# Patient Record
Sex: Female | Born: 1970 | Hispanic: Yes | Marital: Married | Smoking: Never smoker
Health system: Southern US, Community
[De-identification: ages and names within clinical notes are randomized; demographics above are authoritative.]

## PROBLEM LIST (undated history)

## (undated) DIAGNOSIS — R112 Nausea with vomiting, unspecified: Secondary | ICD-10-CM

## (undated) DIAGNOSIS — D649 Anemia, unspecified: Secondary | ICD-10-CM

## (undated) DIAGNOSIS — Z8489 Family history of other specified conditions: Secondary | ICD-10-CM

## (undated) DIAGNOSIS — Z789 Other specified health status: Secondary | ICD-10-CM

## (undated) DIAGNOSIS — E611 Iron deficiency: Secondary | ICD-10-CM

## (undated) DIAGNOSIS — Z9889 Other specified postprocedural states: Secondary | ICD-10-CM

## (undated) HISTORY — DX: Iron deficiency: E61.1

## (undated) HISTORY — PX: CYSTECTOMY: SUR359

---

## 2005-08-03 ENCOUNTER — Emergency Department: Payer: Self-pay | Admitting: Unknown Physician Specialty

## 2017-02-04 ENCOUNTER — Ambulatory Visit: Payer: Self-pay | Attending: Oncology | Admitting: *Deleted

## 2017-02-04 ENCOUNTER — Ambulatory Visit
Admission: RE | Admit: 2017-02-04 | Discharge: 2017-02-04 | Disposition: A | Discharge status: Home / Self Care | Payer: Self-pay | Source: Ambulatory Visit | Attending: Oncology | Admitting: Oncology

## 2017-02-04 ENCOUNTER — Encounter: Payer: Self-pay | Admitting: *Deleted

## 2017-02-04 VITALS — BP 117/85 | HR 79 | Temp 98.5°F | Ht 62.0 in | Wt 231.0 lb

## 2017-02-04 DIAGNOSIS — N63 Unspecified lump in unspecified breast: Secondary | ICD-10-CM

## 2017-02-04 NOTE — Progress Notes (Signed)
Subjective:     Patient ID: Nichole West, female   DOB: Nov 25, 1970, 46 y.o.   MRN: [id number]  HPI   Review of Systems     Objective:   Physical Exam  Pulmonary/Chest: Right breast exhibits no inverted nipple, no mass, no nipple discharge, no skin change and no tenderness. Left breast exhibits no inverted nipple, no mass, no nipple discharge, no skin change and no tenderness. Breasts are symmetrical.         Assessment:     46 year old Hispanic female referred to [place] by the [facility] for palpabe breast mass at 3:00 left breast. Lloyda, the interpreter present during the interview and exam.  The patient states her husband noticed the nodules about 6 months ago.  Denies tenderness.  On clinical breast exam there is no dominant mass, nipple discharge, skin changes or lymphadenopathy.  I can palpate an asymetrical thickening at 12:00 left breast.  I can only palpate fibroglandular like tissue at the 3:00 area bilateral.  I asked the patient to pinpoint the area of concern.  She was unable to locate the previous area of concern.  Taught self breast awareness.  Patient has been screened for eligibility.  She does not have any insurance, Medicare or Medicaid.  She also meets financial eligibility.  Hand-out given on the Affordable Care Act.    Plan:    Will go ahead and get bilateral diagnostic mammogram and ultrasound.  Will follow-up per [facility] protocol.

## 2017-02-04 NOTE — Patient Instructions (Signed)
Gave patient hand-out, Women Staying Healthy, Active and Well from [facility], with education on breast health, pap smears, heart and colon health. 

## 2017-02-05 ENCOUNTER — Other Ambulatory Visit: Payer: Self-pay | Admitting: *Deleted

## 2017-02-05 DIAGNOSIS — N63 Unspecified lump in unspecified breast: Secondary | ICD-10-CM

## 2017-02-13 ENCOUNTER — Ambulatory Visit: Payer: Self-pay

## 2017-02-20 ENCOUNTER — Ambulatory Visit
Admission: RE | Admit: 2017-02-20 | Discharge: 2017-02-20 | Disposition: A | Discharge status: Home / Self Care | Payer: Self-pay | Source: Ambulatory Visit | Attending: Oncology | Admitting: Oncology

## 2017-02-20 DIAGNOSIS — N63 Unspecified lump in unspecified breast: Secondary | ICD-10-CM

## 2017-02-20 HISTORY — PX: BREAST EXCISIONAL BIOPSY: SUR124

## 2017-02-21 LAB — SURGICAL PATHOLOGY

## 2017-02-22 ENCOUNTER — Telehealth: Payer: Self-pay | Admitting: *Deleted

## 2017-02-22 NOTE — Telephone Encounter (Signed)
Called patient via the interpreter Randall Hiss.  Informed her of her benign biopsy and need for surgical consult for possible removal of the papilloma.  She is scheduled to see Dr. Jamal Collin on 02/26/17 at 4:15.  She is arrive 30 minutes early, take a photo ID and all her meds.  She is to call if she has any questions.

## 2017-02-26 ENCOUNTER — Encounter: Payer: Self-pay | Admitting: *Deleted

## 2017-02-26 ENCOUNTER — Ambulatory Visit (INDEPENDENT_AMBULATORY_CARE_PROVIDER_SITE_OTHER): Payer: PRIVATE HEALTH INSURANCE | Admitting: General Surgery

## 2017-02-26 VITALS — BP 130/72 | HR 94 | Resp 12 | Ht 60.0 in | Wt 231.0 lb

## 2017-02-26 DIAGNOSIS — D242 Benign neoplasm of left breast: Secondary | ICD-10-CM

## 2017-02-26 DIAGNOSIS — N6012 Diffuse cystic mastopathy of left breast: Secondary | ICD-10-CM

## 2017-02-26 NOTE — Progress Notes (Signed)
Patient ID: Nichole West, female   DOB: March 05, 1971, 46 y.o.   MRN: [id number]  Chief Complaint  Patient presents with  . Breast Problem    HPI Nichole West is a 46 y.o. female.  who presents for a breast evaluation referred by Tanya Nones [facility]. The most recent mammogram and biopsy was done on 02-20-17. This was her first mammogram. Patient does not perform regular self breast checks.    She could feel something in the left breast but when she went to [facility] they could not feel anything, she was then referred to [facility] for mammograms. Her daughter is present. Interpreter, Barnie Alderman, present for interview, exam and discussion.   HPI  Past Medical History:  Diagnosis Date  . Iron deficiency     Past Surgical History:  Procedure Laterality Date  . BREAST BIOPSY Left 02/20/2017   INTRADUCTAL PAPILLOMA WITH USUAL DUCTAL HYPERPLASIA, COLUMNAR CELL CHANGES  . CESAREAN SECTION      Family History  Problem Relation Age of Onset  . Breast cancer Neg Hx   . Colon cancer Neg Hx     Social History Social History  Substance Use Topics  . Smoking status: Never Smoker  . Smokeless tobacco: Never Used  . Alcohol use No    No Known Allergies  Current Outpatient Prescriptions  Medication Sig Dispense Refill  . acetaminophen (TYLENOL) 325 MG tablet Take 650 mg by mouth every 6 (six) hours as needed.    . Ferrous Gluconate (IRON 27 PO) Take by mouth 2 (two) times daily.    Marland Kitchen ibuprofen (ADVIL,MOTRIN) 600 MG tablet Take 600 mg by mouth every 6 (six) hours as needed.      No current facility-administered medications for this visit.     Review of Systems Review of Systems  Constitutional: Negative.   Respiratory: Negative.   Cardiovascular: Negative.     Blood pressure 130/72, pulse 94, resp. rate 12, height 5' (1.524 m), weight 231 lb (104.8 kg), last menstrual period 02/01/2017.  Physical Exam Physical Exam  Constitutional: She is oriented to person, place, and  time. She appears well-developed and well-nourished.  HENT:  Mouth/Throat: Oropharynx is clear and moist.  Eyes: Conjunctivae are normal. No scleral icterus.  Neck: Neck supple.  Cardiovascular: Normal rate, regular rhythm and normal heart sounds.   Pulmonary/Chest: Effort normal and breath sounds normal. Right breast exhibits no inverted nipple, no mass, no nipple discharge, no skin change and no tenderness. Left breast exhibits no inverted nipple, no mass, no nipple discharge, no skin change and no tenderness.  Abdominal: Soft. There is no tenderness.  Lymphadenopathy:    She has no cervical adenopathy.    She has no axillary adenopathy.  Neurological: She is alert and oriented to person, place, and time.  Skin: Skin is warm and dry.  Psychiatric: Her behavior is normal.    Data Reviewed  Mammogram and pathology reviewed Multiple cysts seen on left breast. A small hypoechoic mass at 5 ocl close to areola was core biopsied Assessment Multiople breast cysts   Left breast mass-core biopsied. 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 No atypia noted    Plan   Explained the pathology findings and small chance for pathologic upgrade if excised. Discussed options in detail,close observation vs excision of the left breast mass. Procedure of excision also explained She wishes to think about this and call back with her decision.      HPI, Physical Exam, Assessment  and Plan have been scribed under the direction and in the presence of Mckinley Jewel, MD Karie Fetch, RN I have completed the exam and reviewed the above documentation for accuracy and completeness.  I agree with the above.  Haematologist has been used and any errors in dictation or transcription are unintentional.  Raeshawn Vo G. Jamal Collin, M.D., F.A.C.S.  Junie Panning G 02/26/2017, 7:00 PM

## 2017-02-26 NOTE — Patient Instructions (Signed)
The patient is aware to call back for any questions or concerns.  

## 2017-03-13 ENCOUNTER — Other Ambulatory Visit: Payer: Self-pay | Admitting: General Surgery

## 2017-03-13 DIAGNOSIS — D242 Benign neoplasm of left breast: Secondary | ICD-10-CM

## 2017-03-14 ENCOUNTER — Other Ambulatory Visit: Payer: Self-pay | Admitting: General Surgery

## 2017-03-14 ENCOUNTER — Other Ambulatory Visit: Payer: Self-pay | Admitting: *Deleted

## 2017-03-14 ENCOUNTER — Telehealth: Payer: Self-pay | Admitting: *Deleted

## 2017-03-14 DIAGNOSIS — D242 Benign neoplasm of left breast: Secondary | ICD-10-CM

## 2017-03-14 NOTE — Telephone Encounter (Signed)
I called and spoke with patient's daughter, Geni Bers, today.   Patient's surgery has been scheduled for 03-26-17. Surgery and pre-admit instructions were reviewed with her today. She verbalizes understanding.   Paperwork with instructions have been mailed to the patient.   Daughter aware to call if questions.

## 2017-03-19 ENCOUNTER — Encounter
Admission: RE | Admit: 2017-03-19 | Discharge: 2017-03-19 | Disposition: A | Discharge status: Home / Self Care | Payer: Self-pay | Source: Ambulatory Visit | Attending: General Surgery | Admitting: General Surgery

## 2017-03-19 DIAGNOSIS — Z01818 Encounter for other preprocedural examination: Secondary | ICD-10-CM | POA: Insufficient documentation

## 2017-03-19 HISTORY — DX: Family history of other specified conditions: Z84.89

## 2017-03-19 HISTORY — DX: Nausea with vomiting, unspecified: R11.2

## 2017-03-19 HISTORY — DX: Anemia, unspecified: D64.9

## 2017-03-19 HISTORY — DX: Other specified postprocedural states: Z98.890

## 2017-03-19 HISTORY — DX: Other specified health status: Z78.9

## 2017-03-19 NOTE — Patient Instructions (Signed)
Your procedure is scheduled on: Tues 03/26/17 Su procedimiento est programado para: Report to Mammography. 8:00 :  Remember: Instructions that are not followed completely may result in serious medical risk, up to and including death, or upon the discretion of your surgeon and anesthesiologist your surgery may need to be rescheduled.  Recuerde: Las instrucciones que no se siguen completamente Heritage manager en un riesgo de salud grave, incluyendo hasta la [place] o a discrecin de su cirujano y Environmental health practitioner, su ciruga se puede posponer.   _x___ 1. Do not eat food or drink liquids after midnight. No gum chewing or hard candies.  No coma alimentos ni tome lquidos despus de la medianoche.  No mastique chicle ni caramelos  duros.     __x__ 2. No alcohol for 24 hours before or after surgery.    No tome alcohol durante las 24 horas antes ni despus de la [place].   ____ 3. Bring all medications with you on the day of surgery if instructed.    Lleve todos los medicamentos con usted el da de su ciruga si se le ha indicado as.   __x__ 4. Notify your doctor if there is any change in your medical condition (cold, fever,                             infections).    Informe a su mdico si hay algn cambio en su condicin mdica (resfriado, fiebre, infecciones).   Do not wear jewelry, make-up, hairpins, clips or nail polish.  No use joyas, maquillajes, pinzas/ganchos para el cabello ni esmalte de uas.  Do not wear lotions, powders, or perfumes. You may wear deodorant.  No use lociones, polvos o perfumes.  Puede usar desodorante.    Do not shave 48 hours prior to surgery. Men may shave face and neck.  No se afeite 48 horas antes de la [place].  Los hombres pueden [employer] cara y el cuello.   Do not bring valuables to the hospital.   No lleve objetos [place] is not responsible for any belongings or valuables.  [place] no se hace responsable de ningn tipo de  pertenencias u objetos de Geographical information systems officer.               Contacts, dentures or bridgework may not be worn into surgery.  Los lentes de [place], las dentaduras postizas o puentes no se pueden usar en la [place].  Leave your suitcase in the car. After surgery it may be brought to your room.  Deje su maleta en el auto.  Despus de la ciruga podr traerla a su habitacin.  For patients admitted to the hospital, discharge time is determined by your treatment team.  Para los pacientes que sean ingresados al hospital, el tiempo en el cual se le dar de alta es determinado por su                equipo de [place].   Patients discharged the day of surgery will not be allowed to drive home. A los pacientes que se les da de alta el mismo da de la ciruga no se les permitir conducir a Holiday representative.   Please read over the following fact sheets that you were given: Por favor [place] hojas de informacin que le dieron:      __x__ Take these medicines the morning of surgery with A SIP OF WATER:  Tome estas medicinas la maana de la ciruga con UN SORBO DE AGUA:  1. acetaminophen (TYLENOL) 325 MG tablet if needed  2.   3.   4.       5.  6.  ____ Fleet Enema (as directed)          Enema de Fleet (segn lo indicado)    __x__ Use CHG Soap as directed          Utilice el jabn de CHG segn lo indicado  ____ Use inhalers on the day of surgery          Use los inhaladores el da de la ciruga  ____ Stop metformin 2 days prior to surgery          Deje de tomar el metformin 2 das antes de la ciruga    ____ Take 1/2 of usual insulin dose the night before surgery and none on the morning of surgery           Tome la mitad de la dosis habitual de insulina la noche antes de la [place] y no tome nada en la maana de la             ciruga  ____ Stop Coumadin/Plavix/aspirin on           Deje de tomar el Coumadin/Plavix/aspirina el da:  __x__ Stop Anti-inflammatories on today           Deje  de tomar antiinflamatorios el da:   ____ Stop supplements until after surgery            Deje de tomar suplementos hasta despus de la ciruga  ____ Bring C-Pap to the hospital          [place] al hospital

## 2017-03-19 NOTE — Pre-Procedure Instructions (Deleted)
Contacted Dr. Nicholaus Bloom office regarding pt's Brilinta and aspirin.  Cameron Proud PA instructed for pt to stop both meds today and restart on 03/21/17 after surgery.

## 2017-03-26 ENCOUNTER — Encounter: Payer: Self-pay | Admitting: Anesthesiology

## 2017-03-26 ENCOUNTER — Ambulatory Visit
Admission: RE | Admit: 2017-03-26 | Discharge: 2017-03-26 | Disposition: A | Discharge status: Home / Self Care | Payer: Self-pay | Source: Ambulatory Visit | Attending: General Surgery | Admitting: General Surgery

## 2017-03-26 ENCOUNTER — Ambulatory Visit: Payer: Self-pay | Admitting: Certified Registered"

## 2017-03-26 ENCOUNTER — Encounter
Admission: RE | Disposition: A | Discharge status: Home / Self Care | Payer: Self-pay | Source: Ambulatory Visit | Attending: General Surgery

## 2017-03-26 DIAGNOSIS — D242 Benign neoplasm of left breast: Secondary | ICD-10-CM

## 2017-03-26 DIAGNOSIS — N6022 Fibroadenosis of left breast: Secondary | ICD-10-CM | POA: Insufficient documentation

## 2017-03-26 DIAGNOSIS — N6002 Solitary cyst of left breast: Secondary | ICD-10-CM | POA: Insufficient documentation

## 2017-03-26 HISTORY — PX: BREAST LUMPECTOMY WITH NEEDLE LOCALIZATION: SHX5759

## 2017-03-26 LAB — CBC
HEMATOCRIT: 37.4 % (ref 35.0–47.0)
HEMOGLOBIN: 12.8 g/dL (ref 12.0–16.0)
MCH: 29 pg (ref 26.0–34.0)
MCHC: 34.2 g/dL (ref 32.0–36.0)
MCV: 84.7 fL (ref 80.0–100.0)
Platelets: 297 10*3/uL (ref 150–440)
RBC: 4.42 MIL/uL (ref 3.80–5.20)
RDW: 15.2 % — ABNORMAL HIGH (ref 11.5–14.5)
WBC: 8.2 10*3/uL (ref 3.6–11.0)

## 2017-03-26 LAB — POCT PREGNANCY, URINE: Preg Test, Ur: NEGATIVE

## 2017-03-26 SURGERY — BREAST LUMPECTOMY WITH NEEDLE LOCALIZATION
Anesthesia: General | Site: Breast | Laterality: Left | Wound class: Clean

## 2017-03-26 MED ORDER — FENTANYL CITRATE (PF) 100 MCG/2ML IJ SOLN
INTRAMUSCULAR | Status: AC
  Filled 2017-03-26: qty 2

## 2017-03-26 MED ORDER — LIDOCAINE HCL (PF) 1 % IJ SOLN
INTRAMUSCULAR | Status: AC
  Filled 2017-03-26: qty 30

## 2017-03-26 MED ORDER — LIDOCAINE HCL 1 % IJ SOLN
INTRAMUSCULAR | Status: DC | PRN
  Administered 2017-03-26: 8 mL

## 2017-03-26 MED ORDER — BUPIVACAINE HCL (PF) 0.5 % IJ SOLN
INTRAMUSCULAR | Status: AC
  Filled 2017-03-26: qty 30

## 2017-03-26 MED ORDER — TRAMADOL HCL 50 MG PO TABS: 50.0000 mg | ORAL_TABLET | Freq: Four times a day (QID) | ORAL | 0 refills | Status: AC | PRN

## 2017-03-26 MED ORDER — ONDANSETRON HCL 4 MG/2ML IJ SOLN
INTRAMUSCULAR | Status: DC | PRN
Start: 2017-03-26 — End: 2017-03-26
  Administered 2017-03-26 (×2): 4 mg via INTRAVENOUS

## 2017-03-26 MED ORDER — PROPOFOL 10 MG/ML IV BOLUS
INTRAVENOUS | Status: AC
  Filled 2017-03-26: qty 40

## 2017-03-26 MED ORDER — PROPOFOL 10 MG/ML IV BOLUS
INTRAVENOUS | Status: DC | PRN
  Administered 2017-03-26: 50 mg via INTRAVENOUS
  Administered 2017-03-26: 200 mg via INTRAVENOUS
  Administered 2017-03-26: 50 mg via INTRAVENOUS

## 2017-03-26 MED ORDER — ONDANSETRON HCL 4 MG/2ML IJ SOLN: 4.0000 mg | Freq: Once | INTRAMUSCULAR | Status: DC | PRN

## 2017-03-26 MED ORDER — LACTATED RINGERS IV SOLN
INTRAVENOUS | Status: DC
  Administered 2017-03-26: 10:00:00 via INTRAVENOUS

## 2017-03-26 MED ORDER — MIDAZOLAM HCL 2 MG/2ML IJ SOLN
INTRAMUSCULAR | Status: DC | PRN
  Administered 2017-03-26: 2 mg via INTRAVENOUS

## 2017-03-26 MED ORDER — FENTANYL CITRATE (PF) 100 MCG/2ML IJ SOLN
25.0000 ug | INTRAMUSCULAR | Status: DC | PRN
  Administered 2017-03-26: 25 ug via INTRAVENOUS

## 2017-03-26 MED ORDER — GLYCOPYRROLATE 0.2 MG/ML IJ SOLN
INTRAMUSCULAR | Status: DC | PRN
  Administered 2017-03-26: 0.2 mg via INTRAVENOUS

## 2017-03-26 MED ORDER — GLYCOPYRROLATE 0.2 MG/ML IJ SOLN
INTRAMUSCULAR | Status: AC
  Filled 2017-03-26: qty 1

## 2017-03-26 MED ORDER — ONDANSETRON HCL 4 MG/2ML IJ SOLN
INTRAMUSCULAR | Status: AC
  Filled 2017-03-26: qty 2

## 2017-03-26 MED ORDER — DEXAMETHASONE SODIUM PHOSPHATE 10 MG/ML IJ SOLN
INTRAMUSCULAR | Status: DC | PRN
  Administered 2017-03-26: 10 mg via INTRAVENOUS

## 2017-03-26 MED ORDER — CHLORHEXIDINE GLUCONATE CLOTH 2 % EX PADS: 6.0000 | MEDICATED_PAD | Freq: Once | CUTANEOUS | Status: DC

## 2017-03-26 MED ORDER — MIDAZOLAM HCL 2 MG/2ML IJ SOLN
INTRAMUSCULAR | Status: AC
  Filled 2017-03-26: qty 2

## 2017-03-26 MED ORDER — FENTANYL CITRATE (PF) 100 MCG/2ML IJ SOLN
INTRAMUSCULAR | Status: AC
  Administered 2017-03-26: 25 ug via INTRAVENOUS
  Filled 2017-03-26: qty 2

## 2017-03-26 MED ORDER — FAMOTIDINE 20 MG PO TABS
20.0000 mg | ORAL_TABLET | Freq: Once | ORAL | Status: AC
  Administered 2017-03-26: 20 mg via ORAL

## 2017-03-26 MED ORDER — FAMOTIDINE 20 MG PO TABS
ORAL_TABLET | ORAL | Status: AC
  Administered 2017-03-26: 20 mg via ORAL
  Filled 2017-03-26: qty 1

## 2017-03-26 MED ORDER — TRAMADOL HCL 50 MG PO TABS
50.0000 mg | ORAL_TABLET | Freq: Four times a day (QID) | ORAL | Status: DC
  Administered 2017-03-26: 50 mg via ORAL

## 2017-03-26 MED ORDER — DEXAMETHASONE SODIUM PHOSPHATE 10 MG/ML IJ SOLN
INTRAMUSCULAR | Status: AC
  Filled 2017-03-26: qty 1

## 2017-03-26 MED ORDER — LACTATED RINGERS IV SOLN
INTRAVENOUS | Status: DC | PRN
  Administered 2017-03-26: 10:00:00 via INTRAVENOUS

## 2017-03-26 MED ORDER — FENTANYL CITRATE (PF) 100 MCG/2ML IJ SOLN
INTRAMUSCULAR | Status: DC | PRN
  Administered 2017-03-26 (×4): 25 ug via INTRAVENOUS

## 2017-03-26 MED ORDER — TRAMADOL HCL 50 MG PO TABS
ORAL_TABLET | ORAL | Status: AC
  Filled 2017-03-26: qty 1

## 2017-03-26 MED ORDER — BUPIVACAINE HCL 0.5 % IJ SOLN
INTRAMUSCULAR | Status: DC | PRN
  Administered 2017-03-26: 8 mL

## 2017-03-26 SURGICAL SUPPLY — 37 items
BLADE SURG 15 STRL SS SAFETY (BLADE) ×2 IMPLANT
CANISTER SUCT 1200ML W/VALVE (MISCELLANEOUS) ×2 IMPLANT
CHLORAPREP W/TINT 26ML (MISCELLANEOUS) ×2 IMPLANT
COVER PROBE FLX POLY STRL (MISCELLANEOUS) ×2 IMPLANT
DECANTER SPIKE VIAL GLASS SM (MISCELLANEOUS) ×2 IMPLANT
DERMABOND ADVANCED (GAUZE/BANDAGES/DRESSINGS) ×1
DERMABOND ADVANCED .7 DNX12 (GAUZE/BANDAGES/DRESSINGS) ×1 IMPLANT
DEVICE DUBIN SPECIMEN MAMMOGRA (MISCELLANEOUS) ×2 IMPLANT
DEVICE LOCALIZATION ULTRAWIRE (WIRE) IMPLANT
DEVICE ULTRAWIRE LOCAL 19X9 (WIRE) IMPLANT
DRAPE FLUOR MINI C-ARM 54X84 (DRAPES) ×2 IMPLANT
DRAPE LAPAROTOMY 100X77 ABD (DRAPES) ×2 IMPLANT
ELECT CAUTERY BLADE TIP 2.5 (TIP) ×2
ELECT REM PT RETURN 9FT ADLT (ELECTROSURGICAL) ×2
ELECTRODE CAUTERY BLDE TIP 2.5 (TIP) ×1 IMPLANT
ELECTRODE REM PT RTRN 9FT ADLT (ELECTROSURGICAL) ×1 IMPLANT
GLOVE BIO SURGEON STRL SZ7 (GLOVE) ×2 IMPLANT
GOWN STRL REUS W/ TWL LRG LVL3 (GOWN DISPOSABLE) ×3 IMPLANT
GOWN STRL REUS W/TWL LRG LVL3 (GOWN DISPOSABLE) ×3
KIT RM TURNOVER STRD PROC AR (KITS) ×2 IMPLANT
LABEL OR SOLS (LABEL) ×2 IMPLANT
MARGIN MAP 10MM (MISCELLANEOUS) ×2 IMPLANT
NDL HPO THNWL 1X22GA REG BVL (NEEDLE) ×1 IMPLANT
NEEDLE HYPO 25X1 1.5 SAFETY (NEEDLE) ×2 IMPLANT
NEEDLE SAFETY 22GX1 (NEEDLE) ×1
PACK BASIN MINOR ARMC (MISCELLANEOUS) ×2 IMPLANT
SUT ETHILON 3-0 FS-10 30 BLK (SUTURE) ×2
SUT VIC AB 2-0 SH 27 (SUTURE) ×1
SUT VIC AB 2-0 SH 27XBRD (SUTURE) ×1 IMPLANT
SUT VIC AB 3-0 54X BRD REEL (SUTURE) ×1 IMPLANT
SUT VIC AB 3-0 BRD 54 (SUTURE) ×1
SUT VIC AB 4-0 FS2 27 (SUTURE) ×2 IMPLANT
SUTURE EHLN 3-0 FS-10 30 BLK (SUTURE) ×1 IMPLANT
SYR CONTROL 10ML (SYRINGE) ×2 IMPLANT
ULTRAWIRE LOCAL DEVICE 19X9 (WIRE)
ULTRAWIRE LOCALIZATION DEVICE (WIRE)
WATER STERILE IRR 1000ML POUR (IV SOLUTION) ×2 IMPLANT

## 2017-03-26 NOTE — Anesthesia Preprocedure Evaluation (Addendum)
Anesthesia Evaluation  Patient identified by MRN, date of birth, ID band Patient awake    Reviewed: Allergy & Precautions, NPO status , Patient's Chart, lab work & pertinent test results, reviewed documented beta blocker date and time   History of Anesthesia Complications (+) PONV, Family history of anesthesia reaction and history of anesthetic complications  Airway Mallampati: III  TM Distance: >3 FB     Dental  (+) Chipped   Pulmonary           Cardiovascular      Neuro/Psych    GI/Hepatic   Endo/Other    Renal/GU      Musculoskeletal   Abdominal   Peds  Hematology  (+) anemia ,   Anesthesia Other Findings Overbite. Daughter went into a coma after a C-section - cause not known. Obese.  Reproductive/Obstetrics                            Anesthesia Physical Anesthesia Plan  ASA: III  Anesthesia Plan: General   Post-op Pain Management:    Induction: Intravenous  PONV Risk Score and Plan:   Airway Management Planned: LMA  Additional Equipment:   Intra-op Plan:   Post-operative Plan:   Informed Consent: I have reviewed the patients History and Physical, chart, labs and discussed the procedure including the risks, benefits and alternatives for the proposed anesthesia with the patient or authorized representative who has indicated his/her understanding and acceptance.     Plan Discussed with: CRNA  Anesthesia Plan Comments:         Anesthesia Quick Evaluation

## 2017-03-26 NOTE — Discharge Instructions (Signed)
CIRUGIA AMBULATORIA       Instruccionnes de alta    Date Toma Copier) 25 de Septiembre 2018   1.  Las drogas que se Statistician en su cuerpo [employer], asi      que por las proximas 24 horas usted no debe:   Conducir Scientist, research (medical)) un automovil   Hacer ninguna decision legal   Tomar ninguna bebida alcoholica  2.  A) [place] puede comenzar una dieta regular.  Es mejor que hoy empiece con                    liquidos y gradualmente anada comidas solidas.       B) Puede comer cualquier comida que desee pero es mejor empezar con liquidos,               luego sopitas con galletas saladas y gradualmente llegar a las comidas solidas.  3.  Por favor avise a su medico inmediatamente si usted tiene algun sangrado anormal,       tiene dificultad con la respiracion, enrojecimiento y Social research officer, government en el sitio de la cirugia,     [place], fiebro o dolor que se alivia con [place].  4.  A) Su visita posoperatoria (despues de su operacion) es con el  Dr. Elon Alas: 2 de Terrilee Croak                   Tiempo: 3:15 PM        B)  Por favor llame para hacer la cita posoperatoria.  5.  Istrucciones especificas :

## 2017-03-26 NOTE — Op Note (Signed)
Preop diagnosis:intraductal papilloma left breast  Post op diagnosis: same  Operation: excision left breast the papilloma with wire localization  Surgeon: Mckinley Jewel  Assistant:     Anesthesia: Gen.  Complications: none  EBL: minimal  Drains: none  Description: patient underwent a wire localization of a lesion located near the 5:00 location in the left breast, near the areolar region. Patient was then brought the operating room and put to sleep with an LMA. Left breast was prepped and draped sterile field.Timeout was. A 15 mL of 0.5% Marcaine mixed 1 Xylocaine was instilled for postop analgesia around the areolar margin inferiorly. A circumareolar incision was then made in the inferior aspect of the breast.The wire entrance was located the medial loca side of the lesion. The skin and subcutaneous tissue with an elevated all the way towards the wire entrance side and was wire was then freed from the skin. The wire was followed for a short distance but because of thesignificant length before the thick part of the wir could be re part of this wire was transected and the rest of the wire was then followed to excise out a tissue core breast seem to contain a firm area consistent with the biopsy site. This was some the lateral end of the excision and appeared to be pretty well-circumscribed. Specimen mammogram confirmed the presence of the clip. The specimen was tagged for margins. After specimen mammogram the tissue was sent for pathology.after ensuring hemostasis the er tissue closed interrupted 2-0s also the subcutaneous tissue and the skin covered with Dermabond. Procedure was well-tolerated and she was subsequently returned recovery room stable condition

## 2017-03-26 NOTE — Interval H&P Note (Signed)
History and Physical Interval Note:  03/26/2017 9:01 AM  Nichole West  has presented today for surgery, with the diagnosis of INTRDUCTAL PAPILLOMA  The various methods of treatment have been discussed with the patient and family. After consideration of risks, benefits and other options for treatment, the patient has consented to  Procedure(s): BREAST LUMPECTOMY WITH NEEDLE LOCALIZATION (Left) as a surgical intervention .  The patient's history has been reviewed, patient examined, no change in status, stable for surgery.  I have reviewed the patient's chart and labs.  Questions were answered to the patient's satisfaction.     [name]

## 2017-03-26 NOTE — Anesthesia Procedure Notes (Signed)
Procedure Name: LMA Insertion Date/Time: 03/26/2017 10:23 AM Performed by: Timoteo Expose Pre-anesthesia Checklist: Patient identified, Emergency Drugs available, Suction available, Patient being monitored and Timeout performed Patient Re-evaluated:Patient Re-evaluated prior to induction Oxygen Delivery Method: Circle system utilized Preoxygenation: Pre-oxygenation with 100% oxygen Induction Type: IV induction Ventilation: Mask ventilation without difficulty LMA: LMA inserted LMA Size: 4.5 Number of attempts: 2 Placement Confirmation: breath sounds checked- equal and bilateral,  positive ETCO2 and CO2 detector

## 2017-03-26 NOTE — Transfer of Care (Signed)
Immediate Anesthesia Transfer of Care Note  Patient: Nichole West  Procedure(s) Performed: Procedure(s): BREAST LUMPECTOMY WITH NEEDLE LOCALIZATION (Left)  Patient Location: PACU  Anesthesia Type:General  Level of Consciousness: awake  Airway & Oxygen Therapy: Patient Spontanous Breathing  Post-op Assessment: Report given to RN  Post vital signs: Reviewed  Last Vitals:  Vitals:   03/26/17 0858 03/26/17 1136  BP: 133/78 111/79  Pulse: 88 (!) 102  Resp: 18 18  Temp: 37.1 C (!) 36.3 C  SpO2: 100% 100%    Last Pain:  Vitals:   03/26/17 0858  TempSrc: Oral         Complications: No apparent anesthesia complications

## 2017-03-26 NOTE — Anesthesia Postprocedure Evaluation (Signed)
Anesthesia Post Note  Patient: Nichole West  Procedure(s) Performed: Procedure(s) (LRB): BREAST LUMPECTOMY WITH NEEDLE LOCALIZATION (Left)  Patient location during evaluation: PACU Anesthesia Type: General Level of consciousness: awake and alert Pain management: pain level controlled Vital Signs Assessment: post-procedure vital signs reviewed and stable Respiratory status: spontaneous breathing, nonlabored ventilation, respiratory function stable and patient connected to nasal cannula oxygen Cardiovascular status: blood pressure returned to baseline and stable Postop Assessment: no apparent nausea or vomiting Anesthetic complications: no     Last Vitals:  Vitals:   03/26/17 1218 03/26/17 1221  BP:  118/74  Pulse: 72 68  Resp: (!) 21 19  Temp:  36.4 C  SpO2: 100% 100%    Last Pain:  Vitals:   03/26/17 1221  TempSrc:   PainSc: [place]

## 2017-03-26 NOTE — Transfer of Care (Signed)
Immediate Anesthesia Transfer of Care Note  Patient: Nichole West  Procedure(s) Performed: Procedure(s): BREAST LUMPECTOMY WITH NEEDLE LOCALIZATION (Left)  Patient Location: PACU  Anesthesia Type:General  Level of Consciousness: drowsy and patient cooperative  Airway & Oxygen Therapy: Patient Spontanous Breathing and Patient connected to face mask oxygen  Post-op Assessment: Report given to RN and Post -op Vital signs reviewed and stable  Post vital signs: Reviewed and stable  Last Vitals:  Vitals:   03/26/17 0858 03/26/17 1136  BP: 133/78 111/79  Pulse: 88 (!) 102  Resp: 18 18  Temp: 37.1 C   SpO2: 100% 100%    Last Pain:  Vitals:   03/26/17 0858  TempSrc: Oral         Complications: No apparent anesthesia complications

## 2017-03-26 NOTE — Anesthesia Post-op Follow-up Note (Signed)
Anesthesia QCDR form completed.        

## 2017-03-26 NOTE — H&P (View-Only) (Signed)
Patient ID: Nichole West, female   DOB: 04/05/1971, 46 y.o.   MRN: [id number]  Chief Complaint  Patient presents with  . Breast Problem    HPI Nichole West is a 46 y.o. female.  who presents for a breast evaluation referred by Tanya Nones [facility]. The most recent mammogram and biopsy was done on 02-20-17. This was her first mammogram. Patient does not perform regular self breast checks.    She could feel something in the left breast but when she went to [facility] they could not feel anything, she was then referred to [facility] for mammograms. Her daughter is present. Interpreter, Barnie Alderman, present for interview, exam and discussion.   HPI  Past Medical History:  Diagnosis Date  . Iron deficiency     Past Surgical History:  Procedure Laterality Date  . BREAST BIOPSY Left 02/20/2017   INTRADUCTAL PAPILLOMA WITH USUAL DUCTAL HYPERPLASIA, COLUMNAR CELL CHANGES  . CESAREAN SECTION      Family History  Problem Relation Age of Onset  . Breast cancer Neg Hx   . Colon cancer Neg Hx     Social History Social History  Substance Use Topics  . Smoking status: Never Smoker  . Smokeless tobacco: Never Used  . Alcohol use No    No Known Allergies  Current Outpatient Prescriptions  Medication Sig Dispense Refill  . acetaminophen (TYLENOL) 325 MG tablet Take 650 mg by mouth every 6 (six) hours as needed.    . Ferrous Gluconate (IRON 27 PO) Take by mouth 2 (two) times daily.    Marland Kitchen ibuprofen (ADVIL,MOTRIN) 600 MG tablet Take 600 mg by mouth every 6 (six) hours as needed.      No current facility-administered medications for this visit.     Review of Systems Review of Systems  Constitutional: Negative.   Respiratory: Negative.   Cardiovascular: Negative.     Blood pressure 130/72, pulse 94, resp. rate 12, height 5' (1.524 m), weight 231 lb (104.8 kg), last menstrual period 02/01/2017.  Physical Exam Physical Exam  Constitutional: She is oriented to person, place, and  time. She appears well-developed and well-nourished.  HENT:  Mouth/Throat: Oropharynx is clear and moist.  Eyes: Conjunctivae are normal. No scleral icterus.  Neck: Neck supple.  Cardiovascular: Normal rate, regular rhythm and normal heart sounds.   Pulmonary/Chest: Effort normal and breath sounds normal. Right breast exhibits no inverted nipple, no mass, no nipple discharge, no skin change and no tenderness. Left breast exhibits no inverted nipple, no mass, no nipple discharge, no skin change and no tenderness.  Abdominal: Soft. There is no tenderness.  Lymphadenopathy:    She has no cervical adenopathy.    She has no axillary adenopathy.  Neurological: She is alert and oriented to person, place, and time.  Skin: Skin is warm and dry.  Psychiatric: Her behavior is normal.    Data Reviewed  Mammogram and pathology reviewed Multiple cysts seen on left breast. A small hypoechoic mass at 5 ocl close to areola was core biopsied Assessment Multiople breast cysts   Left breast mass-core biopsied. 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 No atypia noted    Plan   Explained the pathology findings and small chance for pathologic upgrade if excised. Discussed options in detail,close observation vs excision of the left breast mass. Procedure of excision also explained She wishes to think about this and call back with her decision.      HPI, Physical Exam, Assessment  and Plan have been scribed under the direction and in the presence of Mckinley Jewel, MD Karie Fetch, RN I have completed the exam and reviewed the above documentation for accuracy and completeness.  I agree with the above.  Haematologist has been used and any errors in dictation or transcription are unintentional.  Kaelynn Igo G. Jamal Collin, M.D., F.A.C.S.  Junie Panning G 02/26/2017, 7:00 PM

## 2017-03-28 LAB — SURGICAL PATHOLOGY

## 2017-03-29 ENCOUNTER — Telehealth: Payer: Self-pay

## 2017-03-29 NOTE — Telephone Encounter (Signed)
Notified patient as instructed with [place] from [employer], patient pleased. Discussed follow-up appointments, patient agrees.

## 2017-03-29 NOTE — Telephone Encounter (Signed)
-----   Message from Christene Lye, MD sent at 03/29/2017  8:39 AM EDT ----- Path reviewed. Benign papilloma.  Please inform pt.

## 2017-04-02 ENCOUNTER — Encounter: Payer: Self-pay | Admitting: General Surgery

## 2017-04-02 ENCOUNTER — Ambulatory Visit (INDEPENDENT_AMBULATORY_CARE_PROVIDER_SITE_OTHER): Payer: PRIVATE HEALTH INSURANCE | Admitting: General Surgery

## 2017-04-02 VITALS — BP 144/86 | HR 88 | Resp 12 | Ht 60.0 in | Wt 224.0 lb

## 2017-04-02 DIAGNOSIS — D242 Benign neoplasm of left breast: Secondary | ICD-10-CM

## 2017-04-02 NOTE — Patient Instructions (Signed)
Return to clinic in 1 month for follow up. 

## 2017-04-02 NOTE — Progress Notes (Signed)
Patient ID: Nichole West, female   DOB: March 05, 1971, 46 y.o.   MRN: [id number]  Chief Complaint  Patient presents with  . Routine Post Op    HPI Nichole West is a 46 y.o. female.  Here today for postoperative visit, left breast lumpectomy on 03-26-17, she states she is doing well. She states she is still having some pain. Interpreter, Bonnita Nasuti, present for interview, exam and discussion.  HPI  Past Medical History:  Diagnosis Date  . Anemia   . Family history of adverse reaction to anesthesia    daughter remained in a coma x 5 days after C section/appendectomy  . Iron deficiency   . Medical history non-contributory   . PONV (postoperative nausea and vomiting)     Past Surgical History:  Procedure Laterality Date  . BREAST BIOPSY Left 02/20/2017   INTRADUCTAL PAPILLOMA WITH USUAL DUCTAL HYPERPLASIA, COLUMNAR CELL CHANGES  . BREAST LUMPECTOMY WITH NEEDLE LOCALIZATION Left 03/26/2017   Procedure: BREAST LUMPECTOMY WITH NEEDLE LOCALIZATION;  Surgeon: Christene Lye, MD;  Location: [facility] ORS;  Service: General;  Laterality: Left;  . CESAREAN SECTION    . CYSTECTOMY Left    lower leg    Family History  Problem Relation Age of Onset  . Breast cancer Neg Hx   . Colon cancer Neg Hx     Social History Social History  Substance Use Topics  . Smoking status: Never Smoker  . Smokeless tobacco: Never Used  . Alcohol use No    No Known Allergies  Current Outpatient Prescriptions  Medication Sig Dispense Refill  . acetaminophen (TYLENOL) 325 MG tablet Take 650 mg by mouth every 6 (six) hours as needed.    . ferrous sulfate 325 (65 FE) MG tablet Take 325 mg by mouth 2 (two) times daily with a meal.    . ibuprofen (ADVIL,MOTRIN) 600 MG tablet Take 600 mg by mouth every 6 (six) hours as needed for moderate pain.     . traMADol (ULTRAM) 50 MG tablet Take 1 tablet (50 mg total) by mouth every 6 (six) hours as needed. 10 tablet 0   No current facility-administered  medications for this visit.     Review of Systems Review of Systems  Constitutional: Negative.   Respiratory: Negative.   Cardiovascular: Negative.     Blood pressure (!) 144/86, pulse 88, resp. rate 12, height 5' (1.524 m), weight 224 lb (101.6 kg), last menstrual period 03/28/2017.  Physical Exam Physical Exam  Constitutional: She is oriented to person, place, and time. She appears well-developed and well-nourished.  Pulmonary/Chest:    Neurological: She is alert and oriented to person, place, and time.  Skin: Skin is warm and dry.  Psychiatric: She has a normal mood and affect. Her behavior is normal.    Data Reviewed Prior notes and pathology reviewed-intraductal papilloma  Assessment    Papilloma of left breast s/p lumpectomy - healing well.     Plan    Return to clinic in 1 month for follow up     HPI, Physical Exam, Assessment and Plan have been scribed under the direction and in the presence of Mckinley Jewel, MD Karie Fetch, RN  I have completed the exam and reviewed the above documentation for accuracy and completeness.  I agree with the above.  Haematologist has been used and any errors in dictation or transcription are unintentional.  Toleen Lachapelle G. Jamal Collin, M.D., F.A.C.S.   Junie Panning G 04/02/2017, 4:13 PM

## 2017-04-10 ENCOUNTER — Encounter: Payer: Self-pay | Admitting: *Deleted

## 2017-04-10 NOTE — Progress Notes (Signed)
Patient with benign pathology.  She is being followed by Dr. Jamal Collin.  Next mammogram in one year.  [facility] to [place].

## 2017-05-06 ENCOUNTER — Ambulatory Visit (INDEPENDENT_AMBULATORY_CARE_PROVIDER_SITE_OTHER): Payer: PRIVATE HEALTH INSURANCE | Admitting: General Surgery

## 2017-05-06 ENCOUNTER — Encounter: Payer: Self-pay | Admitting: General Surgery

## 2017-05-06 VITALS — BP 128/74 | HR 78 | Resp 14 | Ht 61.0 in | Wt 226.0 lb

## 2017-05-06 DIAGNOSIS — D242 Benign neoplasm of left breast: Secondary | ICD-10-CM

## 2017-05-06 NOTE — Patient Instructions (Signed)
Llame si hay algn drenaje de su pezn.

## 2017-05-06 NOTE — Progress Notes (Signed)
Patient ID: Nichole West, female   DOB: 10-Apr-1971, 46 y.o.   MRN: [id number]  Chief Complaint  Patient presents with  . Routine Post Op    HPI Nichole West is a 46 y.o. female. Here today for postoperative visit, left breast lumpectomy done on  03-26-17. Reports she is doing well with only some burning pain at the excision site. She reports one episode of clear drainage from the surgical area the day before yesterday. She is here today with her daughter, Nichole West, and also Nichole West from [employer].  HPI  Past Medical History:  Diagnosis Date  . Anemia   . Family history of adverse reaction to anesthesia    daughter remained in a coma x 5 days after C section/appendectomy  . Iron deficiency   . Medical history non-contributory   . PONV (postoperative nausea and vomiting)     Past Surgical History:  Procedure Laterality Date  . BREAST BIOPSY Left 02/20/2017   INTRADUCTAL PAPILLOMA WITH USUAL DUCTAL HYPERPLASIA, COLUMNAR CELL CHANGES  . CESAREAN SECTION    . CYSTECTOMY Left    lower leg    Family History  Problem Relation Age of Onset  . Breast cancer Neg Hx   . Colon cancer Neg Hx     Social History Social History   Tobacco Use  . Smoking status: Never Smoker  . Smokeless tobacco: Never Used  Substance Use Topics  . Alcohol use: No  . Drug use: No    No Known Allergies  Current Outpatient Medications  Medication Sig Dispense Refill  . acetaminophen (TYLENOL) 325 MG tablet Take 650 mg by mouth every 6 (six) hours as needed.    . ferrous sulfate 325 (65 FE) MG tablet Take 325 mg by mouth 2 (two) times daily with a meal.    . ibuprofen (ADVIL,MOTRIN) 600 MG tablet Take 600 mg by mouth every 6 (six) hours as needed for moderate pain.     . traMADol (ULTRAM) 50 MG tablet Take 1 tablet (50 mg total) by mouth every 6 (six) hours as needed. 10 tablet 0   No current facility-administered medications for this visit.     Review of Systems Review of  Systems  Constitutional: Negative.   Respiratory: Negative.   Cardiovascular: Negative.     Blood pressure 128/74, pulse 78, resp. rate 14, height 5\' 1"  (1.549 m), weight 226 lb (102.5 kg), last menstrual period 05/05/2017.  Physical Exam Physical Exam  Constitutional: She is oriented to person, place, and time. She appears well-developed and well-nourished.  Pulmonary/Chest: Right breast exhibits no inverted nipple, no mass, no nipple discharge, no skin change and no tenderness. Left breast exhibits no inverted nipple, no mass, no nipple discharge, no skin change and no tenderness.    Neurological: She is alert and oriented to person, place, and time.  Skin: Skin is warm and dry.  Psychiatric: She has a normal mood and affect.    Data Reviewed Previous notes, op note, mammogram, surgical pathology Left breast surgical pathology- intraductal papilloma with florid ductal hyperplasia and sclerosis. Negative for atypia and malignancy.   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 Incision healing well. One episode of wound discharge since resolved. No signs of infection or nipple discharge today. Most recent mammogram 01/2017. Discussed that she can return to all normal activities as tolerated.     Plan    Pt can return as needed. Should follow up  with her PCP for yearly mammogram and breast evaluation.     HPI, Physical Exam, Assessment and Plan have been scribed under the direction and in the presence of Mckinley Jewel, MD  Concepcion Living, LPN   I have completed the exam and reviewed the above documentation for accuracy and completeness.  I agree with the above.  Haematologist has been used and any errors in dictation or transcription are unintentional.  Helen Winterhalter G. Jamal Collin, M.D., F.A.C.S.  Junie Panning G 05/07/2017, 2:07 PM

## 2018-03-25 ENCOUNTER — Telehealth: Payer: Self-pay

## 2018-03-25 NOTE — Telephone Encounter (Signed)
Patient called me back and I told her that since she was in the [facility] program last year, she would need to contact [facility] for her to re-apply and then they would schedule her necessary images. Patient was given Christy's phone number. Patient understood and had no further questions at this time. I did tell patient to call me back if she needed additional help.

## 2018-03-25 NOTE — Telephone Encounter (Signed)
Called patient back since I had a note on my desk to give her a call. However, I had to leave her a voicemail to call me back.

## 2018-05-28 ENCOUNTER — Ambulatory Visit
Admission: RE | Admit: 2018-05-28 | Discharge: 2018-05-28 | Disposition: A | Discharge status: Home / Self Care | Payer: Self-pay | Source: Ambulatory Visit | Attending: Oncology | Admitting: Oncology

## 2018-05-28 ENCOUNTER — Ambulatory Visit: Payer: Self-pay | Attending: Oncology

## 2018-05-28 ENCOUNTER — Encounter (INDEPENDENT_AMBULATORY_CARE_PROVIDER_SITE_OTHER): Payer: Self-pay

## 2018-05-28 VITALS — BP 145/83 | HR 62 | Temp 98.3°F | Ht 61.25 in | Wt 200.0 lb

## 2018-05-28 DIAGNOSIS — Z0000 Encounter for general adult medical examination without abnormal findings: Secondary | ICD-10-CM

## 2018-05-28 NOTE — Progress Notes (Signed)
  Subjective:     Patient ID: Nichole West, female   DOB: 26-Nov-1970, 47 y.o.   MRN: [id number]  HPI   Review of Systems     Objective:   Physical Exam  Pulmonary/Chest: Right breast exhibits no inverted nipple, no mass, no nipple discharge, no skin change and no tenderness. Left breast exhibits no inverted nipple, no mass, no nipple discharge, no skin change and no tenderness. Breasts are symmetrical.       Assessment:     47 year old patient presents for [facility] clinic visit.  Being left breast biopsy in 2018 with Dr. Jamal Collin.  Jaqui Laukaitis interpreted exam.  Patient screened, and meets [facility] eligibility.  Patient does not have insurance, Medicare or Medicaid.  Handout given on Affordable Care Act.  Instructed patient on breast self awareness using teach back method.  Clinical breast exam unremarkable.  No mass or lump palpated.    Plan:     Sent for bilateral screening mammogram.

## 2018-06-01 NOTE — Progress Notes (Signed)
Letter mailed from [facility] to notify of normal mammogram results.  Patient to return in one year for annual screening.  Copy to [facility]. 

## 2018-06-02 ENCOUNTER — Ambulatory Visit (INDEPENDENT_AMBULATORY_CARE_PROVIDER_SITE_OTHER): Payer: Self-pay | Admitting: Surgery

## 2018-06-02 ENCOUNTER — Encounter: Payer: Self-pay | Admitting: Surgery

## 2018-06-02 ENCOUNTER — Other Ambulatory Visit: Payer: Self-pay

## 2018-06-02 VITALS — BP 135/87 | HR 64 | Temp 97.7°F | Ht 61.25 in | Wt 199.4 lb

## 2018-06-02 DIAGNOSIS — D242 Benign neoplasm of left breast: Secondary | ICD-10-CM

## 2018-06-02 NOTE — Patient Instructions (Signed)
Patient is to return to the office as needed.  Call the office with any questions or concerns. 

## 2018-06-02 NOTE — Progress Notes (Signed)
Outpatient Surgical Follow Up  06/02/2018  Nichole West is an 47 y.o. female.   Chief Complaint  Patient presents with  . Follow-up    Follow Up: Intraductal papilloma of breast, left-Mammogram done on 05/28/2018-Pt was Dr. Angie Fava patient interpreter request    HPI: Mandates a 47 year old female with a prior history of lumpectomy for INTRADUCTAL PAPILLOMA [place] the left breast.  Was operated by Dr. Jamal Collin. He now comes for follow-up for breast exam and to review the mammogram.  I have personally reviewed the mammogram and there is no evidence of any suspicious lesions. He reports some bilateral mastodynia that is mild to moderate in severity , remittent and sharp in nature.  No specific alleviating or aggravating factors.  Past Medical History:  Diagnosis Date  . Anemia   . Family history of adverse reaction to anesthesia    daughter remained in a coma x 5 days after C section/appendectomy  . Iron deficiency   . Medical history non-contributory   . PONV (postoperative nausea and vomiting)     Past Surgical History:  Procedure Laterality Date  . BREAST EXCISIONAL BIOPSY Left 02/20/2017   INTRADUCTAL PAPILLOMA WITH USUAL DUCTAL HYPERPLASIA, COLUMNAR CELL CHANGES  . BREAST LUMPECTOMY WITH NEEDLE LOCALIZATION Left 03/26/2017   Procedure: BREAST LUMPECTOMY WITH NEEDLE LOCALIZATION;  Surgeon: Christene Lye, MD;  Location: [facility] ORS;  Service: General;  Laterality: Left;  . CESAREAN SECTION    . CYSTECTOMY Left    lower leg    Family History  Problem Relation Age of Onset  . Breast cancer Neg Hx   . Colon cancer Neg Hx     Social History:  reports that she has never smoked. She has never used smokeless tobacco. She reports that she does not drink alcohol or use drugs.  Allergies: No Known Allergies  Medications reviewed.    ROS Full ROS performed and is otherwise negative other than what is stated in HPI   BP 135/87    Pulse 64   Temp 97.7 F (36.5 C) (Temporal)   Ht 5' 1.25" (1.556 m)   Wt 199 lb 6.4 oz (90.4 kg)   SpO2 98%   BMI 37.37 kg/m   Physical Exam  Constitutional: She is oriented to person, place, and time. She appears well-developed and well-nourished. No distress.  Neck: Normal range of motion. Neck supple. No JVD present. No tracheal deviation present. No thyromegaly present.  Pulmonary/Chest: Effort normal. No stridor. No respiratory distress. She has no wheezes. She has no rales.  BREAST: No masses, no skin or nipples changes. No D/C. No LAD  Abdominal: Soft. She exhibits no distension and no mass. There is no tenderness. There is no rebound and no guarding.  Lymphadenopathy:    She has no cervical adenopathy.  Neurological: She is alert and oriented to person, place, and time. She displays normal reflexes. No cranial nerve deficit or sensory deficit. She exhibits normal muscle tone. Coordination normal.  Skin: Skin is warm and dry. Capillary refill takes less than 2 seconds.  Psychiatric: She has a normal mood and affect. Her behavior is normal. Judgment and thought content normal.  Nursing note and vitals reviewed.    Assessment/Plan:  Hx of Intraductal papilloma of breast, left mastodynia.  Discussed with patient detail about mammogram findings.  Recommend routine mammogram yearly.  Symptomatic management of mastodynia. No surgical intervention required  Greater than 50% of the 70minutes  visit was spent in counseling/coordination of care  Caroleen Hamman, MD [facility] General Surgeon

## 2019-01-21 ENCOUNTER — Other Ambulatory Visit: Payer: Self-pay

## 2019-01-21 DIAGNOSIS — Z20822 Contact with and (suspected) exposure to covid-19: Secondary | ICD-10-CM

## 2019-01-24 LAB — NOVEL CORONAVIRUS, NAA: SARS-CoV-2, NAA: NOT DETECTED

## 2019-04-28 IMAGING — MG DIGITAL SCREENING BILATERAL MAMMOGRAM WITH TOMO AND CAD
8 series · 9 of 24 positions shown · non-contrast
Comparison: Previous exam(s).

CLINICAL DATA: Screening.

EXAM:
DIGITAL SCREENING BILATERAL MAMMOGRAM WITH TOMO AND CAD

[R MLO synth-2D]
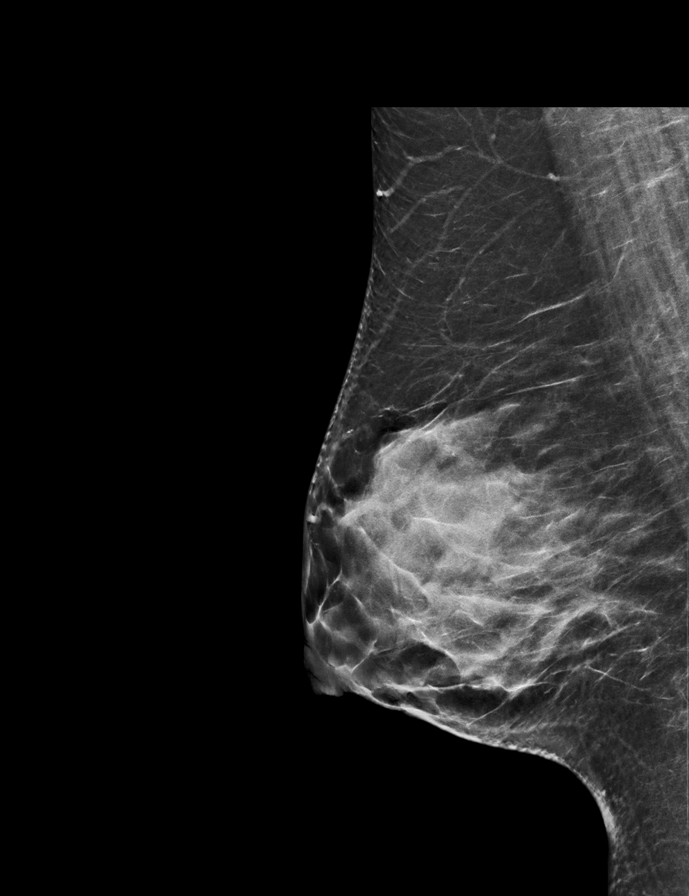

[L MLO synth-2D]
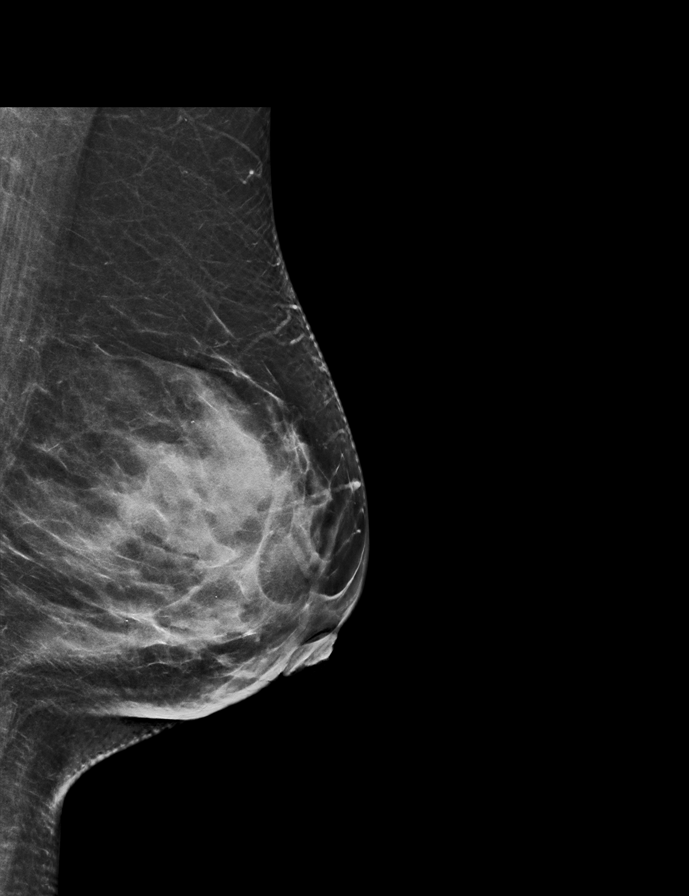

[L CC synth-2D]
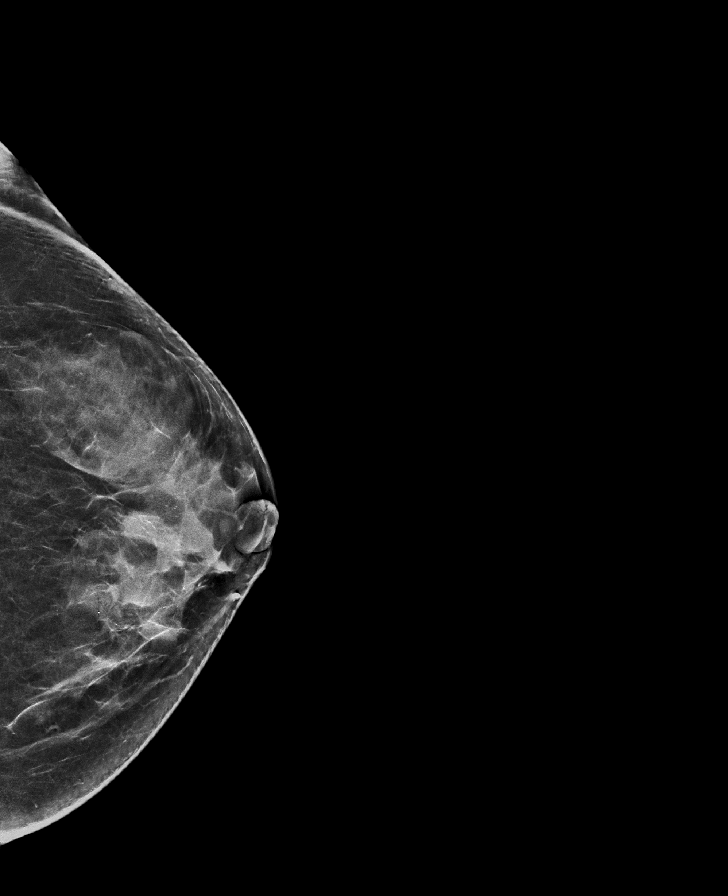

[R CC synth-2D]
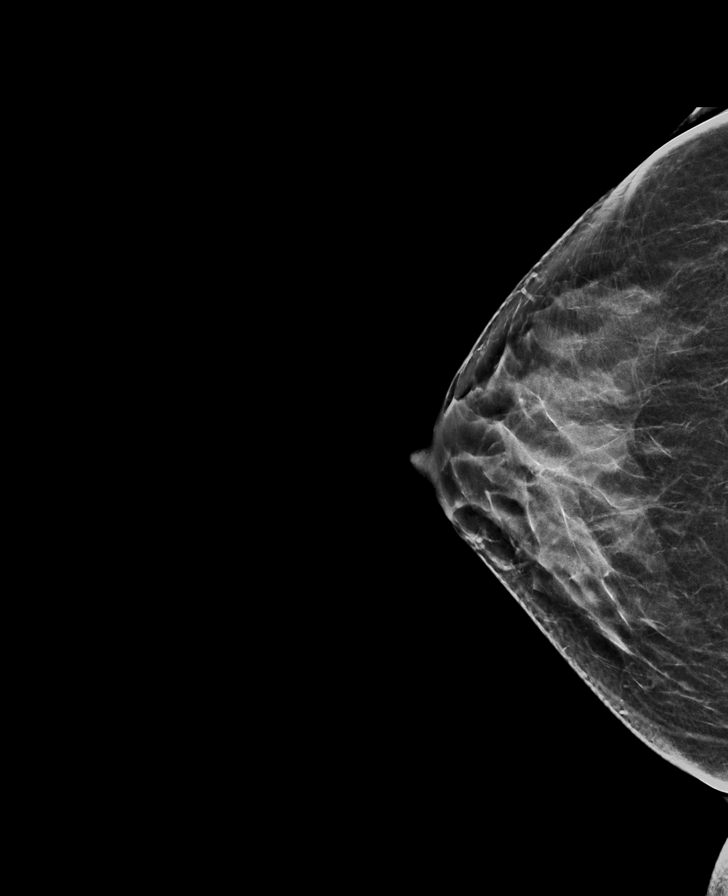

[R CC tomo · 2 of 60 frames shown]
[frame 20/60]
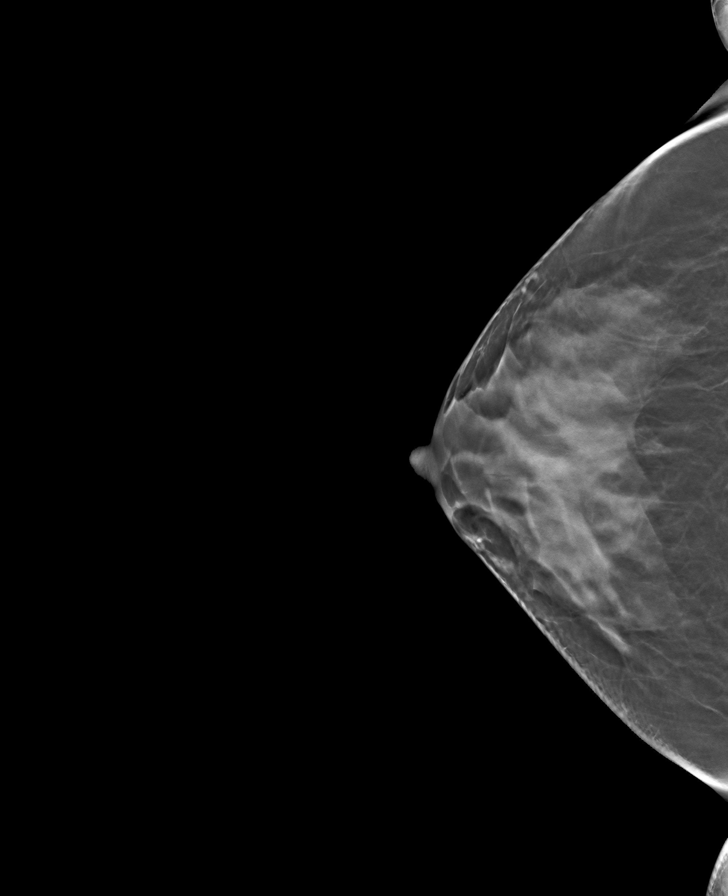
[frame 31/60]
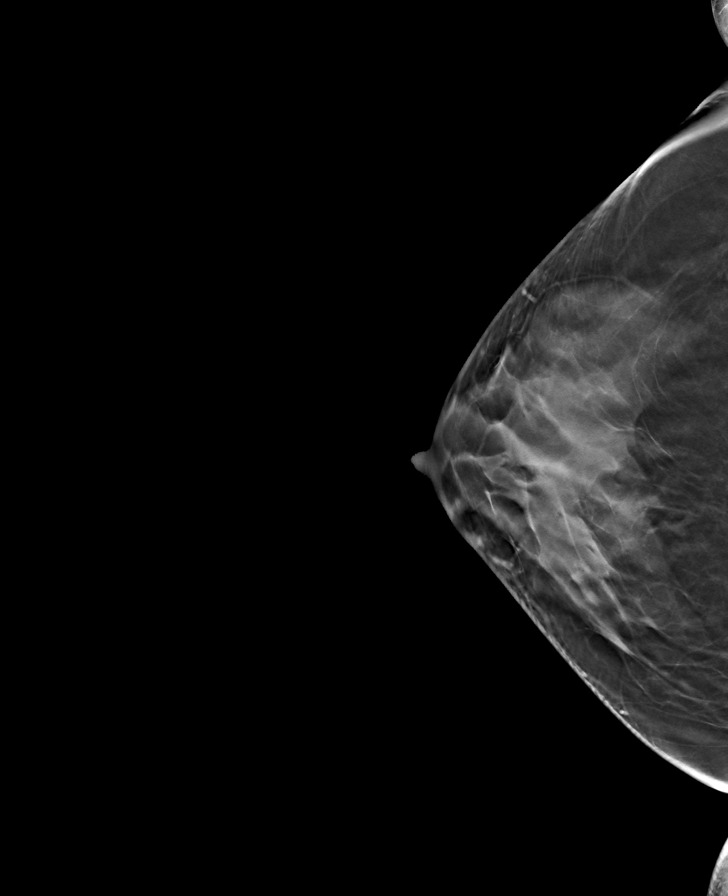

[L MLO tomo · tomo slice 35/68.0]
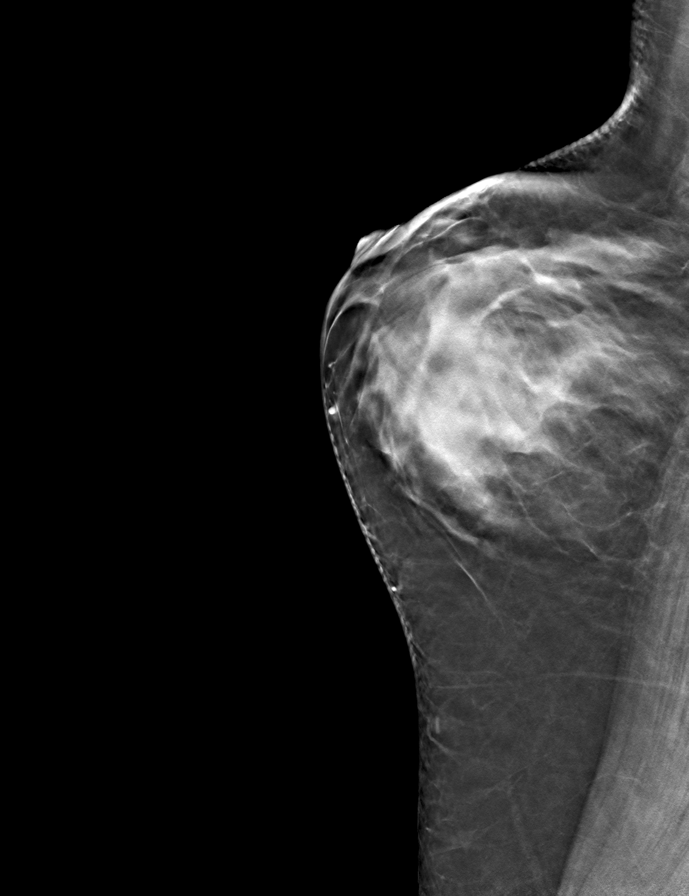

[L CC tomo · tomo slice 33/64.0]
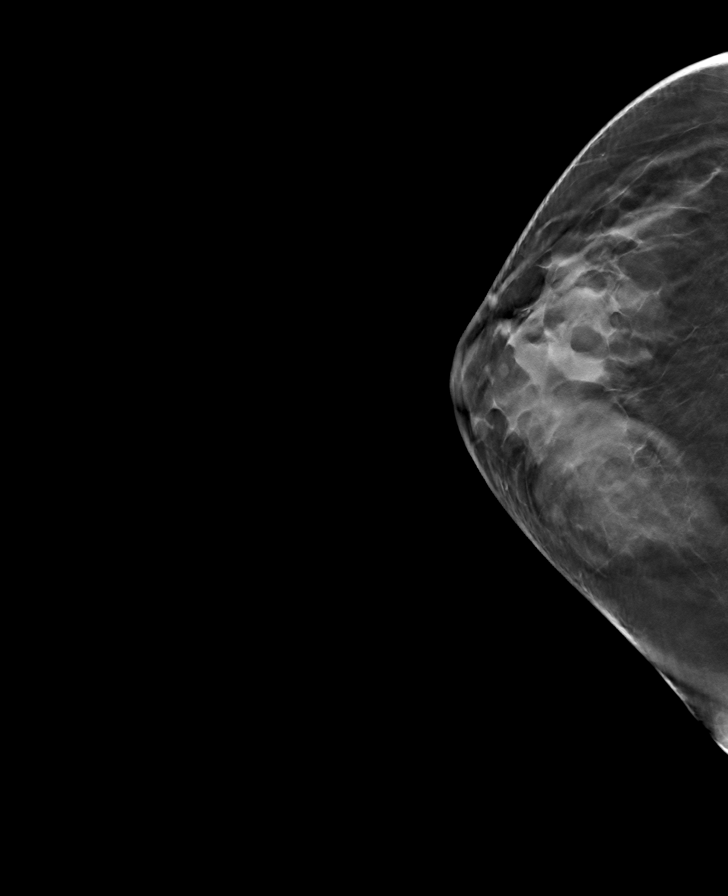

[R MLO tomo · tomo slice 34/67.0]
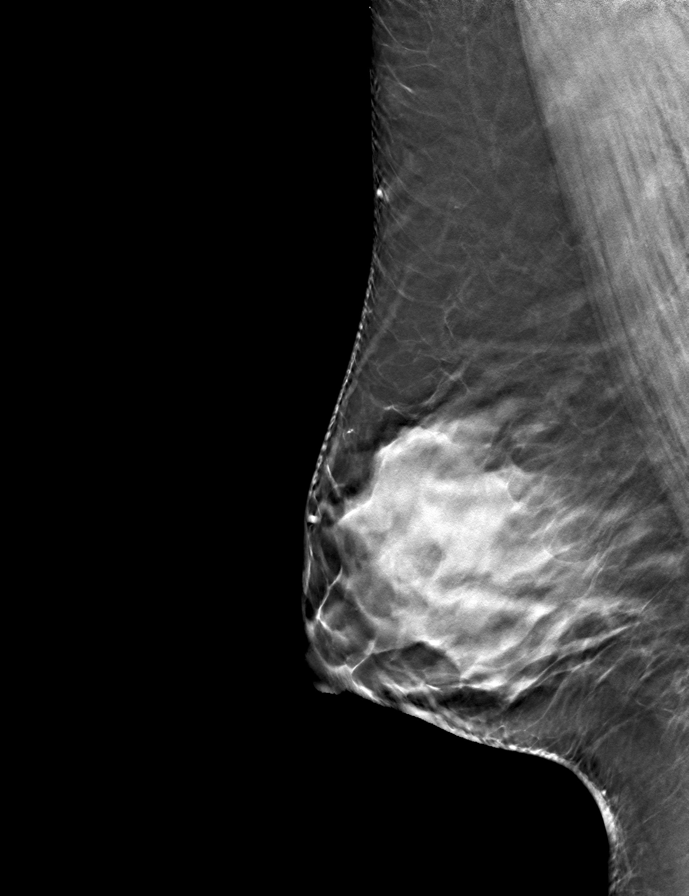

[9 of 24 positions shown; findings below may reference images not displayed]

ACR Breast Density Category c: The breast tissue is heterogeneously
dense, which may obscure small masses.
FINDINGS: There are no findings suspicious for malignancy. Images were
processed with CAD.
IMPRESSION: No mammographic evidence of malignancy. A result letter of this
screening mammogram will be mailed directly to the patient.

RECOMMENDATION:
Screening mammogram in one year. (Code:FT-U-LHB)

BI-RADS CATEGORY  1: Negative.
